# Patient Record
Sex: Female | Born: 1950 | Race: White | Hispanic: No | Marital: Married | Smoking: Former smoker
Health system: Southern US, Community
[De-identification: ages and names within clinical notes are randomized; demographics above are authoritative.]

## PROBLEM LIST (undated history)

## (undated) DIAGNOSIS — E119 Type 2 diabetes mellitus without complications: Secondary | ICD-10-CM

## (undated) DIAGNOSIS — F329 Major depressive disorder, single episode, unspecified: Secondary | ICD-10-CM

## (undated) DIAGNOSIS — C210 Malignant neoplasm of anus, unspecified: Secondary | ICD-10-CM

## (undated) DIAGNOSIS — I10 Essential (primary) hypertension: Secondary | ICD-10-CM

## (undated) DIAGNOSIS — F32A Depression, unspecified: Secondary | ICD-10-CM

## (undated) DIAGNOSIS — I2699 Other pulmonary embolism without acute cor pulmonale: Secondary | ICD-10-CM

## (undated) DIAGNOSIS — I82409 Acute embolism and thrombosis of unspecified deep veins of unspecified lower extremity: Secondary | ICD-10-CM

## (undated) DIAGNOSIS — C50919 Malignant neoplasm of unspecified site of unspecified female breast: Secondary | ICD-10-CM

## (undated) HISTORY — PX: BACK SURGERY: SHX140

## (undated) HISTORY — PX: COLOSTOMY: SHX63

## (undated) HISTORY — PX: BREAST LUMPECTOMY: SHX2

## (undated) HISTORY — DX: Depression, unspecified: F32.A

## (undated) HISTORY — PX: TONSILLECTOMY: SUR1361

## (undated) HISTORY — PX: MEDIAL PARTIAL KNEE REPLACEMENT: SHX5965

## (undated) HISTORY — PX: CARPAL TUNNEL RELEASE: SHX101

## (undated) HISTORY — PX: NECK SURGERY: SHX720

## (undated) HISTORY — DX: Acute embolism and thrombosis of unspecified deep veins of unspecified lower extremity: I82.409

## (undated) HISTORY — DX: Other pulmonary embolism without acute cor pulmonale: I26.99

## (undated) HISTORY — PX: ABDOMINAL HYSTERECTOMY: SHX81

## (undated) HISTORY — DX: Malignant neoplasm of anus, unspecified: C21.0

## (undated) HISTORY — DX: Major depressive disorder, single episode, unspecified: F32.9

---

## 2013-02-26 ENCOUNTER — Ambulatory Visit: Payer: Self-pay | Admitting: Physician Assistant

## 2015-01-02 ENCOUNTER — Emergency Department
Admission: EM | Admit: 2015-01-02 | Discharge: 2015-01-02 | Disposition: A | Discharge status: Home / Self Care | Payer: Medicare Other | Attending: Emergency Medicine | Admitting: Emergency Medicine

## 2015-01-02 ENCOUNTER — Encounter: Payer: Self-pay | Admitting: Emergency Medicine

## 2015-01-02 DIAGNOSIS — R51 Headache: Secondary | ICD-10-CM | POA: Insufficient documentation

## 2015-01-02 DIAGNOSIS — R519 Headache, unspecified: Secondary | ICD-10-CM

## 2015-01-02 DIAGNOSIS — I10 Essential (primary) hypertension: Secondary | ICD-10-CM | POA: Insufficient documentation

## 2015-01-02 DIAGNOSIS — Z880 Allergy status to penicillin: Secondary | ICD-10-CM | POA: Insufficient documentation

## 2015-01-02 DIAGNOSIS — E119 Type 2 diabetes mellitus without complications: Secondary | ICD-10-CM | POA: Insufficient documentation

## 2015-01-02 HISTORY — DX: Malignant neoplasm of unspecified site of unspecified female breast: C50.919

## 2015-01-02 HISTORY — DX: Essential (primary) hypertension: I10

## 2015-01-02 HISTORY — DX: Type 2 diabetes mellitus without complications: E11.9

## 2015-01-02 LAB — CBC WITH DIFFERENTIAL/PLATELET
BASOS ABS: 0.1 10*3/uL (ref 0–0.1)
BASOS PCT: 1 %
EOS ABS: 0.2 10*3/uL (ref 0–0.7)
EOS PCT: 3 %
HCT: 39.8 % (ref 35.0–47.0)
Hemoglobin: 13.1 g/dL (ref 12.0–16.0)
Lymphocytes Relative: 28 %
Lymphs Abs: 1.6 10*3/uL (ref 1.0–3.6)
MCH: 29.8 pg (ref 26.0–34.0)
MCHC: 32.8 g/dL (ref 32.0–36.0)
MCV: 91 fL (ref 80.0–100.0)
MONO ABS: 0.4 10*3/uL (ref 0.2–0.9)
Monocytes Relative: 8 %
Neutro Abs: 3.4 10*3/uL (ref 1.4–6.5)
Neutrophils Relative %: 60 %
PLATELETS: 133 10*3/uL — AB (ref 150–440)
RBC: 4.38 MIL/uL (ref 3.80–5.20)
RDW: 17.1 % — AB (ref 11.5–14.5)
WBC: 5.6 10*3/uL (ref 3.6–11.0)

## 2015-01-02 LAB — COMPREHENSIVE METABOLIC PANEL
ALBUMIN: 3.9 g/dL (ref 3.5–5.0)
ALT: 30 U/L (ref 14–54)
AST: 30 U/L (ref 15–41)
Alkaline Phosphatase: 132 U/L — ABNORMAL HIGH (ref 38–126)
Anion gap: 6 (ref 5–15)
BUN: 14 mg/dL (ref 6–20)
CHLORIDE: 105 mmol/L (ref 101–111)
CO2: 29 mmol/L (ref 22–32)
Calcium: 8.9 mg/dL (ref 8.9–10.3)
Creatinine, Ser: 0.74 mg/dL (ref 0.44–1.00)
GFR calc Af Amer: >60 mL/min (ref >60)
GLUCOSE: 102 mg/dL — AB (ref 65–99)
POTASSIUM: 4 mmol/L (ref 3.5–5.1)
SODIUM: 140 mmol/L (ref 135–145)
Total Bilirubin: 0.2 mg/dL — ABNORMAL LOW (ref 0.3–1.2)
Total Protein: 7 g/dL (ref 6.5–8.1)

## 2015-01-02 MED ORDER — KETOROLAC TROMETHAMINE 30 MG/ML IJ SOLN
INTRAMUSCULAR | Status: AC
  Administered 2015-01-02: 30 mg via INTRAVENOUS
  Filled 2015-01-02: qty 1

## 2015-01-02 MED ORDER — HEPARIN SOD (PORK) LOCK FLUSH 100 UNIT/ML IV SOLN
INTRAVENOUS | Status: AC
  Administered 2015-01-02: 500 [IU]
  Filled 2015-01-02: qty 5

## 2015-01-02 MED ORDER — HEPARIN SOD (PORK) LOCK FLUSH 10 UNIT/ML IV SOLN: 10.0000 [IU] | Freq: Once | INTRAVENOUS | Status: DC

## 2015-01-02 MED ORDER — SODIUM CHLORIDE 0.9 % IV SOLN
Freq: Once | INTRAVENOUS | Status: AC
  Administered 2015-01-02: 21:00:00 via INTRAVENOUS

## 2015-01-02 MED ORDER — KETOROLAC TROMETHAMINE 30 MG/ML IJ SOLN
30.0000 mg | Freq: Once | INTRAMUSCULAR | Status: AC
  Administered 2015-01-02: 30 mg via INTRAVENOUS

## 2015-01-02 NOTE — ED Notes (Signed)
Patient presents to ED with c/o "I am dehydrated and have a headache since yesterday." States she has been eating and drinking water, + photosensitivity and dizziness when standing. Denies N/V, abdominal pain or chest pain. Patient is a cancer patient. Alert and oriented x 4, respirations even and unlabored. Family at bedside.

## 2015-01-02 NOTE — ED Notes (Signed)
Pt arrived to the ED for complaints of headache and dehydration. Pt states that she is a cancer Pt and she has many episodes of dehydration per month. Pt is AOx4 in no apparent distress.

## 2015-01-02 NOTE — Discharge Instructions (Signed)

## 2015-01-02 NOTE — ED Provider Notes (Addendum)
[facility] Emergency Department Provider Note     Time seen: ----------------------------------------- 7:51 PM on 01/02/2015 -----------------------------------------    I have reviewed the triage vital signs and the nursing notes.   HISTORY  Chief Complaint Headache and Dehydration    HPI Dana Wilson is a 64 y.o. female who presents ER for complaints of headache and dehydration. Patient states she has cancer and she has many episodes of dehydration per month. Patient notes this by demonstrating skin tenting on the backs of her hands. States she breaks out into a sweat frequently, has had nausea but no vomiting or diarrhea. Patient denies fevers chills or other complaints     Past Medical History  Diagnosis Date  . Breast cancer   . Diabetes mellitus without complication   . Hypertension     There are no active problems to display for this patient.   Past Surgical History  Procedure Laterality Date  . Medial partial knee replacement    . Neck surgery    . Back surgery    . Carpal tunnel release    . Breast lumpectomy      Allergies Other; Penicillins; and Tape  Social History Social History  Substance Use Topics  . Smoking status: Never Smoker   . Smokeless tobacco: None  . Alcohol Use: No    Review of Systems Constitutional: Negative for fever, positive for sweats Eyes: Negative for visual changes. ENT: Negative for sore throat. Cardiovascular: Negative for chest pain. Respiratory: Negative for shortness of breath. Gastrointestinal: Negative for abdominal pain, positive for nausea Genitourinary: Negative for dysuria. Musculoskeletal: Negative for back pain. Skin: Negative for rash. Neurological: Positive for headache  10-point ROS otherwise negative.  ____________________________________________   PHYSICAL EXAM:  VITAL SIGNS: ED Triage Vitals  Enc Vitals Group     BP 01/02/15 1921 137/78 mmHg     Pulse Rate  01/02/15 1921 75     Resp 01/02/15 1921 18     Temp 01/02/15 1921 98.7 F (37.1 C)     Temp Source 01/02/15 1921 Oral     SpO2 01/02/15 1921 94 %     Weight 01/02/15 1921 238 lb (107.956 kg)     Height 01/02/15 1921 5\' 4"  (1.626 m)     Head Cir --      Peak Flow --      Pain Score 01/02/15 1923 8     Pain Loc --      Pain Edu? --      Excl. in [place]? --     Constitutional: Alert and oriented. Well appearing and in no distress. Eyes: Conjunctivae are normal. PERRL. Normal extraocular movements. ENT   Head: Normocephalic and atraumatic.   Nose: No congestion/rhinnorhea.   Mouth/Throat: Mucous membranes are moist.   Neck: No stridor. Cardiovascular: Normal rate, regular rhythm. Normal and symmetric distal pulses are present in all extremities. No murmurs, rubs, or gallops. Respiratory: Normal respiratory effort without tachypnea nor retractions. Breath sounds are clear and equal bilaterally. No wheezes/rales/rhonchi. Gastrointestinal: Soft and nontender. No distention. No abdominal bruits.  Musculoskeletal: Nontender with normal range of motion in all extremities. No joint effusions.  No lower extremity tenderness nor edema. Neurologic:  Normal speech and language. No gross focal neurologic deficits are appreciated. Speech is normal. No gait instability. Skin:  Skin is warm, dry and intact. No rash noted. Psychiatric: Mood and affect are normal. Speech and behavior are normal. Patient exhibits appropriate insight and judgment.  ____________________________________________  ED  COURSE:  Pertinent labs & imaging results that were available during my care of the patient were reviewed by me and considered in my medical decision making (see chart for details). Patient be given saline, will check basic labs and reevaluate with IV Toradol for headache. ____________________________________________    LABS (pertinent positives/negatives)  Labs Reviewed  CBC WITH  DIFFERENTIAL/PLATELET - Abnormal; Notable for the following:    RDW 17.1 (*)    Platelets 133 (*)    All other components within normal limits  COMPREHENSIVE METABOLIC PANEL - Abnormal; Notable for the following:    Glucose, Bld 102 (*)    Alkaline Phosphatase 132 (*)    Total Bilirubin 0.2 (*)    All other components within normal limits    ____________________________________________  FINAL ASSESSMENT AND PLAN  Headache  Plan: Patient with labs and imaging as dictated above. Patient does not appear significantly dehydrated, she was given saline and Toradol to ease her symptoms. She is stable for outpatient follow-up.   Earleen Newport, MD   Earleen Newport, MD 01/02/15 2003  Earleen Newport, MD 01/02/15 6384  Earleen Newport, MD 01/02/15 2236

## 2017-09-23 ENCOUNTER — Encounter: Payer: Self-pay | Admitting: Oncology

## 2017-09-23 ENCOUNTER — Inpatient Hospital Stay: Payer: Medicare Other

## 2017-09-23 ENCOUNTER — Inpatient Hospital Stay: Payer: Medicare Other | Attending: Oncology | Admitting: Oncology

## 2017-09-23 VITALS — BP 138/81 | HR 73 | Temp 97.9°F | Resp 18 | Ht 64.0 in | Wt 259.2 lb

## 2017-09-23 DIAGNOSIS — Z803 Family history of malignant neoplasm of breast: Secondary | ICD-10-CM | POA: Insufficient documentation

## 2017-09-23 DIAGNOSIS — Z933 Colostomy status: Secondary | ICD-10-CM

## 2017-09-23 DIAGNOSIS — Z800 Family history of malignant neoplasm of digestive organs: Secondary | ICD-10-CM | POA: Insufficient documentation

## 2017-09-23 DIAGNOSIS — R5383 Other fatigue: Secondary | ICD-10-CM | POA: Diagnosis not present

## 2017-09-23 DIAGNOSIS — R5381 Other malaise: Secondary | ICD-10-CM | POA: Insufficient documentation

## 2017-09-23 DIAGNOSIS — Z86711 Personal history of pulmonary embolism: Secondary | ICD-10-CM | POA: Diagnosis not present

## 2017-09-23 DIAGNOSIS — Z95828 Presence of other vascular implants and grafts: Secondary | ICD-10-CM

## 2017-09-23 DIAGNOSIS — Z853 Personal history of malignant neoplasm of breast: Secondary | ICD-10-CM | POA: Diagnosis present

## 2017-09-23 DIAGNOSIS — Z9221 Personal history of antineoplastic chemotherapy: Secondary | ICD-10-CM

## 2017-09-23 DIAGNOSIS — Z87440 Personal history of urinary (tract) infections: Secondary | ICD-10-CM

## 2017-09-23 DIAGNOSIS — E119 Type 2 diabetes mellitus without complications: Secondary | ICD-10-CM | POA: Diagnosis not present

## 2017-09-23 DIAGNOSIS — Z85048 Personal history of other malignant neoplasm of rectum, rectosigmoid junction, and anus: Secondary | ICD-10-CM | POA: Diagnosis not present

## 2017-09-23 DIAGNOSIS — Z452 Encounter for adjustment and management of vascular access device: Secondary | ICD-10-CM | POA: Diagnosis not present

## 2017-09-23 DIAGNOSIS — Z807 Family history of other malignant neoplasms of lymphoid, hematopoietic and related tissues: Secondary | ICD-10-CM | POA: Insufficient documentation

## 2017-09-23 DIAGNOSIS — I10 Essential (primary) hypertension: Secondary | ICD-10-CM | POA: Diagnosis not present

## 2017-09-23 DIAGNOSIS — Z79899 Other long term (current) drug therapy: Secondary | ICD-10-CM

## 2017-09-23 DIAGNOSIS — Z923 Personal history of irradiation: Secondary | ICD-10-CM | POA: Diagnosis not present

## 2017-09-23 DIAGNOSIS — Z87891 Personal history of nicotine dependence: Secondary | ICD-10-CM | POA: Insufficient documentation

## 2017-09-23 DIAGNOSIS — Z7901 Long term (current) use of anticoagulants: Secondary | ICD-10-CM | POA: Insufficient documentation

## 2017-09-23 DIAGNOSIS — C20 Malignant neoplasm of rectum: Secondary | ICD-10-CM

## 2017-09-23 DIAGNOSIS — C50919 Malignant neoplasm of unspecified site of unspecified female breast: Secondary | ICD-10-CM

## 2017-09-23 MED ORDER — HEPARIN SOD (PORK) LOCK FLUSH 100 UNIT/ML IV SOLN
500.0000 [IU] | Freq: Once | INTRAVENOUS | Status: AC
  Administered 2017-09-23: 500 [IU] via INTRAVENOUS

## 2017-09-23 MED ORDER — SODIUM CHLORIDE 0.9% FLUSH
10.0000 mL | INTRAVENOUS | Status: DC | PRN
  Administered 2017-09-23: 10 mL via INTRAVENOUS
  Filled 2017-09-23: qty 10

## 2017-09-23 NOTE — Progress Notes (Signed)
Pt here from [place] and she stay with her daughter and grand children for extended amounts of time. No sx. Just port flush.

## 2017-09-23 NOTE — Progress Notes (Signed)
Symptom Management Consult note [facility]  [phone] [phone]  Patient Care Team: Georga Kaufmann, [place] as PCP - General (Pain Medicine)   Name of the patient: Brylee Berk  [id number]  12/03/1950   Date of visit: 09/23/17  Diagnosis- Breast and Anal Cancer   Chief complaint/ Reason for visit- PORT Flush/ New patient   Heme/Onc history:  67 year old female visiting from [place], [place].  Patient presents for routine port flush.  States she has a history of stage II left breast cancer (2015) s/p lumpectomy.  Had chemo and radiation.  States she did not require "hormone pill" after chemotherapy.  Additionally she has a history of anal cancer diagnosed in May 2016.  In patient thought she had hemorrhoids and went to her PCP.  Biopsy revealed anal cancer.  She also had radiation and chemo with colostomy in December 2016.  Required skilled nursing facility for several months after surgery.  Has fully recovered and receives follow-up in [place] where she was with her husband.  Had recent back surgery in January 2019.  States she is doing well.  Unfortunately, she does not have any records available with her.   Has history of recent urinary tract infection treated with antibiotics and pulmonary embolism.  On chronic Xarelto since 2018.  States she is here often would like to establish care so that she may have her port flushed while visiting her daughters..  Interval history-  Patient presents to have her port flushed.  Last port flush was approximately 6 weeks ago.  She does not plan to return home to [place] for approximately 1 more month and is afraid of her Port-A-Cath becoming "clogged".  Requesting port flush if possible.  Denies any problems with her port in the past.  Denies any recent infections.  Continuing to recover from recent back surgery.  Overall doing well.  ECOG FS:0 - Asymptomatic  Review of systems- Review of Systems    Constitutional: Positive for malaise/fatigue (From previous chemo/radiation from anal and breast cancer.). Negative for chills, fever and weight loss.  HENT: Negative for congestion and ear pain.   Eyes: Negative.  Negative for blurred vision and double vision.  Respiratory: Negative.  Negative for cough, sputum production and shortness of breath.   Cardiovascular: Negative.  Negative for chest pain, palpitations and leg swelling.  Gastrointestinal: Negative.  Negative for abdominal pain, constipation, diarrhea, nausea and vomiting.  Genitourinary: Negative for dysuria, frequency and urgency.  Musculoskeletal: Positive for back pain (Chronic back pain.  Recent back surgery.). Negative for falls.  Skin: Negative.  Negative for rash.  Neurological: Positive for weakness (Chronic.  Continuing to recover from back surgery.). Negative for headaches.  Endo/Heme/Allergies: Negative.  Does not bruise/bleed easily.  Psychiatric/Behavioral: Negative.  Negative for depression. The patient is not nervous/anxious and does not have insomnia.      Current treatment- Observation  Allergies  Allergen Reactions  . Calcium Diarrhea and Other (See Comments)    gas  . Other Other (See Comments)    Surgical steel gives an infection to the Pt.   Marland Kitchen Penicillins Hives  . Zolpidem Other (See Comments)    confusion  . Amoxicillin Rash  . Ceftriaxone Rash  . Cifenline Rash  . Ciprofloxacin Rash  . Levofloxacin Rash  . Tape Rash     Past Medical History:  Diagnosis Date  . Anal cancer ([place])   . Breast cancer ([place])   . Depression   . Diabetes mellitus  without complication ([place])   . DVT (deep venous thrombosis) ([place])   . Hypertension   . Pulmonary embolus [facility])      Past Surgical History:  Procedure Laterality Date  . ABDOMINAL HYSTERECTOMY    . BACK SURGERY    . BREAST LUMPECTOMY    . CARPAL TUNNEL RELEASE    . MEDIAL PARTIAL KNEE REPLACEMENT    . NECK SURGERY    . TONSILLECTOMY       Social History   Socioeconomic History  . Marital status: Married    Spouse name: Not on file  . Number of children: Not on file  . Years of education: Not on file  . Highest education level: Not on file  Occupational History  . Not on file  Social Needs  . Financial resource strain: Not on file  . Food insecurity:    Worry: Not on file    Inability: Not on file  . Transportation needs:    Medical: Not on file    Non-medical: Not on file  Tobacco Use  . Smoking status: Former Smoker    Last attempt to quit: 01/17/1990    Years since quitting: 27.7  . Smokeless tobacco: Never Used  Substance and Sexual Activity  . Alcohol use: No  . Drug use: No  . Sexual activity: Never  Lifestyle  . Physical activity:    Days per week: Not on file    Minutes per session: Not on file  . Stress: Not on file  Relationships  . Social connections:    Talks on phone: Not on file    Gets together: Not on file    Attends religious service: Not on file    Active member of club or organization: Not on file    Attends meetings of clubs or organizations: Not on file    Relationship status: Not on file  . Intimate partner violence:    Fear of current or ex partner: Not on file    Emotionally abused: Not on file    Physically abused: Not on file    Forced sexual activity: Not on file  Other Topics Concern  . Not on file  Social History Narrative  . Not on file    Family History  Problem Relation Age of Onset  . COPD Mother   . Emphysema Mother   . Alzheimer's disease Father   . Stroke Sister   . Diabetes Sister   . Hypertension Sister   . Liver cancer Brother   . Diabetes Brother   . Hypertension Brother   . Breast cancer Paternal Aunt   . Breast cancer Cousin   . Colon cancer Cousin   . Prostate cancer Brother   . Diabetes Brother   . Hypertension Brother   . Sleep apnea Sister      Current Outpatient Medications:  .  buPROPion (ZYBAN) 150 MG 12 hr tablet, Take 150 mg  by mouth daily., Disp: , Rfl:  .  DULoxetine (CYMBALTA) 60 MG capsule, Take 60 mg by mouth daily., Disp: , Rfl:  .  enalapril (VASOTEC) 20 MG tablet, Take 20 mg by mouth daily., Disp: , Rfl:  .  esomeprazole (NEXIUM) 40 MG capsule, Take 40 mg by mouth daily at 12 noon., Disp: , Rfl:  .  gabapentin (NEURONTIN) 100 MG capsule, Take 100 mg by mouth 3 (three) times daily., Disp: , Rfl:  .  HYDROcodone-acetaminophen (NORCO) 10-325 MG tablet, Take 1 tablet by mouth every 6 (six) hours  as needed., Disp: , Rfl:  .  meclizine (ANTIVERT) 25 MG tablet, Take 25 mg by mouth 3 (three) times daily as needed for dizziness., Disp: , Rfl:  .  ondansetron (ZOFRAN) 8 MG tablet, Take by mouth every 8 (eight) hours as needed for nausea or vomiting., Disp: , Rfl:  .  rivaroxaban (XARELTO) 20 MG TABS tablet, Take 20 mg by mouth daily with supper., Disp: , Rfl:  .  rizatriptan (MAXALT-MLT) 10 MG disintegrating tablet, Take 10 mg by mouth as needed for migraine. May repeat in 2 hours if needed, Disp: , Rfl:  .  solifenacin (VESICARE) 10 MG tablet, Take by mouth daily., Disp: , Rfl:   Physical exam:  Vitals:   09/23/17 1053  BP: 138/81  Pulse: 73  Resp: 18  Temp: 97.9 F (36.6 C)  TempSrc: Tympanic  Weight: 259 lb 3.2 oz (117.6 kg)  Height: 5\' 4"  (1.626 m)   Physical Exam  Constitutional: She is oriented to person, place, and time. Vital signs are normal.  HENT:  Head: Normocephalic and atraumatic.  Eyes: Pupils are equal, round, and reactive to light.  Neck: Normal range of motion.  Cardiovascular: Normal rate, regular rhythm and normal heart sounds.  No murmur heard. Pulmonary/Chest: Effort normal and breath sounds normal. She has no wheezes.  Abdominal: Soft. Normal appearance and bowel sounds are normal. She exhibits no distension. There is no tenderness.  Musculoskeletal: Normal range of motion. She exhibits no edema.  Neurological: She is alert and oriented to person, place, and time.  Skin: Skin is  warm and dry. No rash noted.  Psychiatric: Judgment normal.     CMP Latest Ref Rng & Units 01/02/2015  Glucose 65 - 99 mg/dL 102(H)  BUN 6 - 20 mg/dL 14  Creatinine 0.44 - 1.00 mg/dL 0.74  Sodium 135 - 145 mmol/L 140  Potassium 3.5 - 5.1 mmol/L 4.0  Chloride 101 - 111 mmol/L 105  CO2 22 - 32 mmol/L 29  Calcium 8.9 - 10.3 mg/dL 8.9  Total Protein 6.5 - 8.1 g/dL 7.0  Total Bilirubin 0.3 - 1.2 mg/dL 0.2(L)  Alkaline Phos 38 - 126 U/L 132(H)  AST 15 - 41 U/L 30  ALT 14 - 54 U/L 30   CBC Latest Ref Rng & Units 01/02/2015  WBC 3.6 - 11.0 K/uL 5.6  Hemoglobin 12.0 - 16.0 g/dL 13.1  Hematocrit 35.0 - 47.0 % 39.8  Platelets 150 - 440 K/uL 133(L)    No images are attached to the encounter.  No results found.   Assessment and plan- Patient is a 67 y.o. female who presents for routine port flush.  Visiting from [place].  1.  History of stage II breast and rectal cancer: Requesting records be sent [facility] for [facility].  States he had left breast lumpectomy with chemo/radiation.  History of rectal cancer s/p chemo and radiation.  Followed and managed by primary medical oncologist in [place].  Requesting routine port flush.    Visit Diagnosis 1. Malignant neoplasm of female breast, unspecified estrogen receptor status, unspecified laterality, unspecified site of breast [facility])     Patient expressed understanding and was in agreement with this plan. She also understands that She can call clinic at any time with any questions, concerns, or complaints.   Greater than 50% was spent in counseling and coordination of care with this patient including but not limited to discussion of the relevant topics above (See A&P) including, but not limited to diagnosis and management of  acute and chronic medical conditions.    Faythe Casa, AGNP-C [facility] [place]- [phone] Pager- [phone] 10/04/2017 2:31 PM

## 2017-09-24 ENCOUNTER — Telehealth: Payer: Self-pay | Admitting: Oncology

## 2017-09-24 NOTE — Telephone Encounter (Signed)
No LOS, per 09/23/17 visit.

## 2019-07-21 ENCOUNTER — Encounter: Payer: Self-pay | Admitting: Emergency Medicine

## 2019-07-21 ENCOUNTER — Other Ambulatory Visit: Payer: Self-pay

## 2019-07-21 ENCOUNTER — Ambulatory Visit
Admission: EM | Admit: 2019-07-21 | Discharge: 2019-07-21 | Disposition: A | Discharge status: Home / Self Care | Payer: Medicare Other | Attending: Family Medicine | Admitting: Family Medicine

## 2019-07-21 DIAGNOSIS — N390 Urinary tract infection, site not specified: Secondary | ICD-10-CM | POA: Insufficient documentation

## 2019-07-21 DIAGNOSIS — N760 Acute vaginitis: Secondary | ICD-10-CM

## 2019-07-21 LAB — WET PREP, GENITAL
Clue Cells Wet Prep HPF POC: NONE SEEN
Sperm: NONE SEEN
Trich, Wet Prep: NONE SEEN
Yeast Wet Prep HPF POC: NONE SEEN

## 2019-07-21 LAB — URINALYSIS, COMPLETE (UACMP) WITH MICROSCOPIC
Bilirubin Urine: NEGATIVE
Glucose, UA: NEGATIVE mg/dL
Ketones, ur: NEGATIVE mg/dL
Nitrite: NEGATIVE
Protein, ur: 30 mg/dL — AB
Specific Gravity, Urine: >1.03 — ABNORMAL HIGH (ref 1.005–1.030)
pH: 6 (ref 5.0–8.0)

## 2019-07-21 MED ORDER — FLUCONAZOLE 200 MG PO TABS: ORAL_TABLET | ORAL | 0 refills | Status: DC

## 2019-07-21 MED ORDER — SULFAMETHOXAZOLE-TRIMETHOPRIM 800-160 MG PO TABS: 1.0000 | ORAL_TABLET | Freq: Two times a day (BID) | ORAL | 0 refills | Status: AC

## 2019-07-21 NOTE — ED Triage Notes (Signed)
Patient c/o burning when urinating and vaginal itching that started 4 days ago.

## 2019-07-21 NOTE — Discharge Instructions (Signed)
Your urine is consistent with UTI. Complete course of antibiotics.  I have sent your urine to be cultured to confirm appropriate antibiotic initiation.We would call you if culture indicates need to change antibiotics.  Your wet prep does not have yeast present today, however, with your symptoms and history, as well with starting antibiotics, I will go ahead and treat for yeast. Take 1 pill today and repeat second at completion of antibiotics.  If symptoms worsen or do not improve in the next week to return to be seen or to follow up with your PCP.

## 2019-07-21 NOTE — ED Provider Notes (Signed)
[facility]    CSN: [id number] Arrival date & time: 07/21/19  1431      History   Chief Complaint Chief Complaint  Patient presents with  . Dysuria  . Vaginal Itching    HPI BLIMY TUDOR is a 69 y.o. female.   Avon Gully presents with complaints of vaginal itching which started approximately 3-4 days ago, along with pain with pain with urination, urgency and frequency with urination. No blood to urine noted. No new back pain. No fevers. History of vaginal yeast as well as UTI's which have felt similar. History of DM. 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    ROS per HPI, negative if not otherwise mentioned.      Past Medical History:  Diagnosis Date  . Anal cancer ([place])   . Breast cancer ([place])   . Depression   . Diabetes mellitus without complication ([place])   . DVT (deep venous thrombosis) ([place])   . Hypertension   . Pulmonary embolus ([facility])     There are no problems to display for this patient.   Past Surgical History:  Procedure Laterality Date  . ABDOMINAL HYSTERECTOMY    . BACK SURGERY    . BREAST LUMPECTOMY    . CARPAL TUNNEL RELEASE    . MEDIAL PARTIAL KNEE REPLACEMENT    . NECK SURGERY    . TONSILLECTOMY      OB History    Gravida  5   Para  3   Term  3   Preterm  0   AB  2   Living  0     SAB  2   TAB  0   Ectopic  0   Multiple  0   Live Births               Home Medications    Prior to Admission medications   Medication Sig Start Date End Date Taking? Authorizing Provider  buPROPion (ZYBAN) 150 MG 12 hr tablet Take 150 mg by mouth daily.   Yes [provider]  DULoxetine (CYMBALTA) 60 MG capsule Take 60 mg by mouth daily.   Yes [provider]  enalapril (VASOTEC) 20 MG tablet Take 20 mg by mouth daily.   Yes [provider]  esomeprazole (NEXIUM) 40 MG capsule Take 40 mg by mouth daily at 12 noon.   Yes [provider]  gabapentin (NEURONTIN) 100 MG  capsule Take 100 mg by mouth 3 (three) times daily.   Yes [provider]  HYDROcodone-acetaminophen (NORCO) 10-325 MG tablet Take 1 tablet by mouth every 6 (six) hours as needed.   Yes [provider]  metFORMIN (GLUCOPHAGE-XR) 500 MG 24 hr tablet Take 500 mg by mouth daily. 07/16/19  Yes [provider]  ondansetron (ZOFRAN-ODT) 8 MG disintegrating tablet Take 8 mg by mouth 3 (three) times daily. 06/26/19  Yes [provider]  rivaroxaban (XARELTO) 20 MG TABS tablet Take 20 mg by mouth daily with supper.   Yes [provider]  rizatriptan (MAXALT-MLT) 10 MG disintegrating tablet Take 10 mg by mouth as needed for migraine. May repeat in 2 hours if needed   Yes [provider]  rOPINIRole (REQUIP) 1 MG tablet Take 1 mg by mouth at bedtime.   Yes [provider]  solifenacin (VESICARE) 10 MG tablet Take by mouth daily.   Yes [provider]  TRELEGY ELLIPTA 100-62.5-25 MCG/INH AEPB Inhale 1 puff into the lungs daily.  07/17/19  Yes [provider]  fluconazole (DIFLUCAN) 200 MG tablet Take once today. Take second pill at completion of antibiotics. 07/21/19   Zigmund Gottron, NP  meclizine (ANTIVERT) 25 MG tablet Take 25 mg by mouth 3 (three) times daily as needed for dizziness.    [provider]  ondansetron (ZOFRAN) 8 MG tablet Take by mouth every 8 (eight) hours as needed for nausea or vomiting.    [provider]  sulfamethoxazole-trimethoprim (BACTRIM DS) 800-160 MG tablet Take 1 tablet by mouth 2 (two) times daily for 5 days. 07/21/19 07/26/19  Zigmund Gottron, NP    Family History Family History  Problem Relation Age of Onset  . COPD Mother   . Emphysema Mother   . Alzheimer's disease Father   . Stroke Sister   . Diabetes Sister   . Hypertension Sister   . Liver cancer Brother   . Diabetes Brother   . Hypertension Brother   . Breast cancer Paternal Aunt   . Breast cancer Cousin   . Colon  cancer Cousin   . Prostate cancer Brother   . Diabetes Brother   . Hypertension Brother   . Sleep apnea Sister     Social History Social History   Tobacco Use  . Smoking status: Former Smoker    Quit date: 01/17/1990    Years since quitting: 29.5  . Smokeless tobacco: Never Used  Substance Use Topics  . Alcohol use: No  . Drug use: No     Allergies   Calcium, Other, Penicillins, Zolpidem, Amoxicillin, Ceftriaxone, Cifenline, Ciprofloxacin, Levofloxacin, and Tape   Review of Systems Review of Systems   Physical Exam Triage Vital Signs ED Triage Vitals  Enc Vitals Group     BP 07/21/19 1445 139/84     Pulse Rate 07/21/19 1445 70     Resp 07/21/19 1445 16     Temp 07/21/19 1445 98.3 F (36.8 C)     Temp Source 07/21/19 1445 Oral     SpO2 07/21/19 1445 96 %     Weight 07/21/19 1439 270 lb (122.5 kg)     Height 07/21/19 1439 5\' 3"  (1.6 m)     Head Circumference --      Peak Flow --      Pain Score 07/21/19 1439 3     Pain Loc --      Pain Edu? --      Excl. in [place]? --    No data found.  Updated Vital Signs BP 139/84 (BP Location: Right Arm)   Pulse 70   Temp 98.3 F (36.8 C) (Oral)   Resp 16   Ht 5\' 3"  (1.6 m)   Wt 270 lb (122.5 kg)   SpO2 96%   BMI 47.83 kg/m   Physical Exam Constitutional:      General: She is not in acute distress.    Appearance: She is well-developed.  Cardiovascular:     Rate and Rhythm: Normal rate.  Pulmonary:     Effort: Pulmonary effort is normal.  Abdominal:     Tenderness: There is no abdominal tenderness. There is no right CVA tenderness, left CVA tenderness or guarding.  Skin:    General: Skin is warm and dry.  Neurological:     Mental Status: She is alert and oriented to person, place, and time.      UC Treatments / Results  Labs (all labs ordered are listed, but only abnormal results are displayed) Labs Reviewed  WET PREP, GENITAL - Abnormal; Notable for the following components:      Result Value   WBC,  Wet Prep HPF POC FEW (*)    All other components within normal limits  URINALYSIS, COMPLETE (UACMP) WITH MICROSCOPIC - Abnormal; Notable for the following components:   APPearance CLOUDY (*)    Specific Gravity, Urine >1.030 (*)    Hgb urine dipstick SMALL (*)    Protein, ur 30 (*)    Leukocytes,Ua MODERATE (*)    Bacteria, UA FEW (*)    All other components within normal limits  URINE CULTURE    EKG   Radiology No results found.  Procedures Procedures (including critical care time)  Medications Ordered in UC Medications - No data to display  Initial Impression / Assessment and Plan / UC Course  I have reviewed the triage vital signs and the nursing notes.  Pertinent labs & imaging results that were available during my care of the patient were reviewed by me and considered in my medical decision making (see chart for details).     Urinary symptoms with leuks, few bacteria and small hgb, will treat for uti. Culture pending. Wet prep without yeast. However, with significant itching, DM, and initiating antibiotics, opted to cover with diflucan. Discussed follow up if this does not improve, as may need to consider other sources of itching. Return precautions provided. Patient verbalized understanding and agreeable to plan.   Final Clinical Impressions(s) / UC Diagnoses   Final diagnoses:  Lower urinary tract infectious disease  Acute vaginitis     Discharge Instructions     Your urine is consistent with UTI. Complete course of antibiotics.  I have sent your urine to be cultured to confirm appropriate antibiotic initiation.We would call you if culture indicates need to change antibiotics.  Your wet prep does not have yeast present today, however, with your symptoms and history, as well with starting antibiotics, I will go ahead and treat for yeast. Take 1 pill today and repeat second at completion of antibiotics.  If symptoms worsen or do not improve in the next week to  return to be seen or to follow up with your PCP.      ED Prescriptions    Medication Sig Dispense Auth. Provider   fluconazole (DIFLUCAN) 200 MG tablet Take once today. Take second pill at completion of antibiotics. 2 tablet Augusto Gamble B, NP   sulfamethoxazole-trimethoprim (BACTRIM DS) 800-160 MG tablet Take 1 tablet by mouth 2 (two) times daily for 5 days. 10 tablet Zigmund Gottron, NP     PDMP not reviewed this encounter.   Zigmund Gottron, NP 07/21/19 1523

## 2019-07-24 LAB — URINE CULTURE: Culture: >=100000 — AB

## 2019-08-09 ENCOUNTER — Other Ambulatory Visit: Payer: Self-pay

## 2019-08-09 ENCOUNTER — Ambulatory Visit: Payer: Medicare Other

## 2019-08-09 ENCOUNTER — Ambulatory Visit
Admission: EM | Admit: 2019-08-09 | Discharge: 2019-08-09 | Disposition: A | Discharge status: Home / Self Care | Payer: Medicare Other | Attending: Emergency Medicine | Admitting: Emergency Medicine

## 2019-08-09 DIAGNOSIS — M25512 Pain in left shoulder: Secondary | ICD-10-CM | POA: Insufficient documentation

## 2019-08-09 DIAGNOSIS — M542 Cervicalgia: Secondary | ICD-10-CM | POA: Diagnosis present

## 2019-08-09 MED ORDER — TIZANIDINE HCL 4 MG PO TABS: 4.0000 mg | ORAL_TABLET | Freq: Three times a day (TID) | ORAL | 0 refills | Status: DC | PRN

## 2019-08-09 MED ORDER — METHYLPREDNISOLONE 4 MG PO TBPK: ORAL_TABLET | Freq: Every day | ORAL | 0 refills | Status: DC

## 2019-08-09 NOTE — ED Triage Notes (Addendum)
Pt presents with c/o pain radiating down her neck into her left shoulder/arm increasing over the past few days. Pt has history of cervical surgery last year to correct a possible ruptured disc (pt unsure of exactly what was done). Pt reports decreased ROM in her left arm. Pt does have full ROM in the left hand.

## 2019-08-09 NOTE — Discharge Instructions (Addendum)
Your x-rays did not show any acute changes or areas that would be worrisome for recurrent cancer.  increase Tylenol from 500 to 1,000 mg 3-4 times a day as needed for pain,  may take the plain hydrocodone with Tylenol.  Make sure that the hydrocodone does not have Tylenol with it.  If it does, then do not take the additional Tylenol.  Do not exceed 4 g of Tylenol from all sources 24 hours.  Zanaflex for muscle spasms and the medrol Dosepak for inflammation.  Watch your sugars carefully as the steroids will increase your glucose.  Discontinue Flexeril.

## 2019-08-09 NOTE — ED Provider Notes (Signed)
HPI  SUBJECTIVE:  Dana Wilson is a right-handed 69 y.o. female who presents with 3 days of constant stabbing left neck pain that radiates into her shoulder and down into her arm.  She reports limitation of motion of her neck and arm secondary to the pain.  No trauma, she did not sleep on her neck wrong, no numbness tingling.  no fevers body aches chest pain shortness of breath abdominal pain.  No arm swelling, erythema.  No change in physical activity.  She states her left trapezius was swollen yesterday.  She states that she has had pain like this before in her neck prior to having the neck surgery, but this is different in the fact that this is now radiating down her arm.  She took Tylenol within 6 hours of evaluation.  She tried alternating plain hydrocodone with 500 mg of Tylenol every 6 hours as needed Flexeril and ice.  Symptoms are better with turning her head to the right, worse with turning her neck, flexion extension of the neck, turning her head to the left, moving left shoulder.  Past medical history  breast cancer, anal cancer no mets with either cancers, right upper extremity DVT, PE on Xarelto, diabetes hypertension, neck surgery x2, Covid pneumonia November 2020, Port-A-Cath on the right side since 2014.  No history of coronary disease, MI.  PMD: In [place].  She is visiting.  States that she plans on returning to [place] on 4/8.   Past Medical History:  Diagnosis Date  . Anal cancer ([place])   . Breast cancer ([place])   . Depression   . Diabetes mellitus without complication ([place])   . DVT (deep venous thrombosis) ([place])   . Hypertension   . Pulmonary embolus [facility])     Past Surgical History:  Procedure Laterality Date  . ABDOMINAL HYSTERECTOMY    . BACK SURGERY    . BREAST LUMPECTOMY    . CARPAL TUNNEL RELEASE    . COLOSTOMY    . MEDIAL PARTIAL KNEE REPLACEMENT    . NECK SURGERY    . TONSILLECTOMY      Family History  Problem Relation Age of Onset  . COPD Mother   .  Emphysema Mother   . Alzheimer's disease Father   . Stroke Sister   . Diabetes Sister   . Hypertension Sister   . Liver cancer Brother   . Diabetes Brother   . Hypertension Brother   . Breast cancer Paternal Aunt   . Breast cancer Cousin   . Colon cancer Cousin   . Prostate cancer Brother   . Diabetes Brother   . Hypertension Brother   . Sleep apnea Sister     Social History   Tobacco Use  . Smoking status: Former Smoker    Quit date: 01/17/1990    Years since quitting: 29.5  . Smokeless tobacco: Never Used  Substance Use Topics  . Alcohol use: No  . Drug use: No    No current facility-administered medications for this encounter.  Current Outpatient Medications:  .  buPROPion (ZYBAN) 150 MG 12 hr tablet, Take 150 mg by mouth daily., Disp: , Rfl:  .  DULoxetine (CYMBALTA) 60 MG capsule, Take 60 mg by mouth daily., Disp: , Rfl:  .  enalapril (VASOTEC) 20 MG tablet, Take 20 mg by mouth daily., Disp: , Rfl:  .  esomeprazole (NEXIUM) 40 MG capsule, Take 40 mg by mouth daily at 12 noon., Disp: , Rfl:  .  gabapentin (NEURONTIN)  100 MG capsule, Take 100 mg by mouth 3 (three) times daily., Disp: , Rfl:  .  HYDROcodone-acetaminophen (NORCO) 10-325 MG tablet, Take 1 tablet by mouth every 6 (six) hours as needed., Disp: , Rfl:  .  metFORMIN (GLUCOPHAGE-XR) 500 MG 24 hr tablet, Take 500 mg by mouth daily., Disp: , Rfl:  .  ondansetron (ZOFRAN-ODT) 8 MG disintegrating tablet, Take 8 mg by mouth 3 (three) times daily., Disp: , Rfl:  .  rivaroxaban (XARELTO) 20 MG TABS tablet, Take 20 mg by mouth daily with supper., Disp: , Rfl:  .  rizatriptan (MAXALT-MLT) 10 MG disintegrating tablet, Take 10 mg by mouth as needed for migraine. May repeat in 2 hours if needed, Disp: , Rfl:  .  rOPINIRole (REQUIP) 1 MG tablet, Take 1 mg by mouth at bedtime., Disp: , Rfl:  .  solifenacin (VESICARE) 10 MG tablet, Take by mouth daily., Disp: , Rfl:  .  TRELEGY ELLIPTA 100-62.5-25 MCG/INH AEPB, Inhale 1 puff  into the lungs daily., Disp: , Rfl:  .  methylPREDNISolone (MEDROL DOSEPAK) 4 MG TBPK tablet, Take by mouth daily. Follow package instructions, Disp: 21 tablet, Rfl: 0 .  tiZANidine (ZANAFLEX) 4 MG tablet, Take 1 tablet (4 mg total) by mouth every 8 (eight) hours as needed for muscle spasms., Disp: 30 tablet, Rfl: 0  Allergies  Allergen Reactions  . Calcium Diarrhea and Other (See Comments)    gas  . Other Other (See Comments)    Surgical steel gives an infection to the Pt.   Marland Kitchen Penicillins Hives  . Zolpidem Other (See Comments)    confusion  . Amoxicillin Rash  . Ceftriaxone Rash  . Cifenline Rash  . Ciprofloxacin Rash  . Levofloxacin Rash  . Tape Rash     ROS  As noted in HPI.   Physical Exam  BP (!) 144/88 (BP Location: Right Arm)   Pulse 71   Temp 98.3 F (36.8 C) (Oral) Comment: pt preports normal is 96.8  Ht 5\' 4"  (1.626 m)   Wt 117.9 kg   SpO2 98%   BMI 44.63 kg/m   Constitutional: Well developed, well nourished, no acute distress Eyes:  EOMI, conjunctiva normal bilaterally HENT: Normocephalic, atraumatic,mucus membranes moist Respiratory: Normal inspiratory effort Cardiovascular: Normal rate.  No tenderness over the Port-A-Cath right side. GI: nondistended, nontender skin: No rash, skin intact Musculoskeletal: Shoulder symmetric.  Positive diffuse C-spine tenderness.  Positive left trapezial tenderness. L shoulder with ROM somewhat limited-cannot AB duct past 90 degrees . drop test normal, clavicle NT, A/C joint NT , scapula NT , proximal humerus NT, Motor strength normal. Sensation intact LT over deltoid region, distal NVI with hand having grossly intact sensation and strength in the distribution of the median, radial, and ulnar nerve.  RP 2+.  No pain with internal rotation, pain with external rotation, Positive tenderness in bicipital groove, positive empty can test, negative liftoff test, no instability but the pain is aggravated with with abduction/external  rotation. Neurologic: Alert & oriented x 3, no focal neuro deficits Psychiatric: Speech and behavior appropriate   ED Course   Medications - No data to display  Orders Placed This Encounter  Procedures  . DG Cervical Spine Complete    Standing Status:   Standing    Number of Occurrences:   1    Order Specific Question:   Reason for Exam (SYMPTOM  OR DIAGNOSIS REQUIRED)    Answer:   neck, l shoulder pain h/o ca r/o mets, acute changes  .  DG Shoulder Left    Standing Status:   Standing    Number of Occurrences:   1    Order Specific Question:   Reason for Exam (SYMPTOM  OR DIAGNOSIS REQUIRED)    Answer:   neck, l shoulder pain h/o ca r/o mets, acute changes    No results found for this or any previous visit (from the past 24 hour(s)). DG Cervical Spine Complete  Result Date: 08/09/2019 CLINICAL DATA:  Cervicalgia.  History of breast carcinoma EXAM: CERVICAL SPINE - COMPLETE 4+ VIEW COMPARISON:  None. FINDINGS: Frontal, lateral, open-mouth odontoid, and bilateral oblique views were obtained. Patient is status post screw and plate fixation with disc spacer at C3 and C4. A second area of anterior screw and plate fixation noted from C5-C7 with disc spacers at C5-6 and C6-7. No fracture or spondylolisthesis. Prevertebral soft tissues and predental space regions are normal. There is partial ankylosis at the sites of disc spaces. Other disc spaces appear normal. There is bony hypertrophy and the facets at C3-4 bilaterally with exit foraminal narrowing bilaterally in this area. No similar changes elsewhere. No blastic or lytic bone lesions. Lung apices are clear. IMPRESSION: Postoperative change at multiple levels with support hardware intact at all levels. 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 Partial ankylosis at sites with disc spacers. Other disc spaces appear normal. No blastic or lytic bone lesions. No fracture or spondylolisthesis. Electronically Signed   By: Lowella Grip III M.D.    On: 08/09/2019 09:34   DG Shoulder Left  Result Date: 08/09/2019 CLINICAL DATA:  Pain.  History of breast carcinoma EXAM: LEFT SHOULDER - 2+ VIEW COMPARISON:  None. FINDINGS: Oblique, Y scapular, and axillary images were obtained. No fracture or dislocation. Joint spaces appear normal. No erosive change or intra-articular calcification. Visualized left lung clear. No blastic or lytic bone lesions. Postoperative change noted in lower cervical region. IMPRESSION: No fracture or dislocation. No evident arthropathy. No blastic or lytic bone lesions evident. Electronically Signed   By: Lowella Grip III M.D.   On: 08/09/2019 09:34    ED Clinical Impression  1. Neck pain   2. Acute pain of left shoulder      ED Assessment/Plan  No note of neck surgery in [facility] or care everywhere records  Appears to be musculoskeletal.  It is reproducible with palpation, range of motion. Doubt recurrent PE, upper extremity DVT, ACS, intra-abdominal or pulmonary process, septic joint.  Given her history of cancer, C-spine fusion/neck surgery x2,  will check C-spine and shoulder to rule out mets, or for any acute changes.  States that she is compliant with her Xarelto.  If x-rays are normal plan to send home with increased Tylenol from 500 to 1,000 mg 3-4 times a day as needed for pain, discussed with her that she may take the plain hydrocodone with Tylenol.  Will send home with Zanaflex and consider short course of steroids.  States that she has tolerated steroids before and has not had any issues with her glucose.  Discontinue Flexeril.  She states that she has plenty of hydrocodone.  She will follow-up with her doctor in [place] after 4/8.  Gave her ER return precautions.  Reviewed imaging independently.  No fracture, dislocation, blastic or lytic bone lesions evident.  Multiple levels of postoperative changes with hardware intact.  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  See radiology report for full details.  No acute  changes on plain films.  Plan As above  Discussed imaging, MDM, treatment  plan, and plan for follow-up with patient. Discussed sn/sx that should prompt return to the ED. patient agrees with plan.   Meds ordered this encounter  Medications  . methylPREDNISolone (MEDROL DOSEPAK) 4 MG TBPK tablet    Sig: Take by mouth daily. Follow package instructions    Dispense:  21 tablet    Refill:  0  . tiZANidine (ZANAFLEX) 4 MG tablet    Sig: Take 1 tablet (4 mg total) by mouth every 8 (eight) hours as needed for muscle spasms.    Dispense:  30 tablet    Refill:  0    *This clinic note was created using [employer]. Therefore, there may be occasional mistakes despite careful proofreading.   ?    Melynda Ripple, MD 08/09/19 [id number]

## 2019-08-15 ENCOUNTER — Ambulatory Visit
Admission: EM | Admit: 2019-08-15 | Discharge: 2019-08-15 | Disposition: A | Discharge status: Home / Self Care | Payer: Medicare Other | Attending: Family Medicine | Admitting: Family Medicine

## 2019-08-15 ENCOUNTER — Other Ambulatory Visit: Payer: Self-pay

## 2019-08-15 DIAGNOSIS — Z20822 Contact with and (suspected) exposure to covid-19: Secondary | ICD-10-CM | POA: Diagnosis not present

## 2019-08-15 LAB — SARS CORONAVIRUS 2 (TAT 6-24 HRS): SARS Coronavirus 2: NEGATIVE

## 2019-08-15 NOTE — ED Triage Notes (Addendum)
Pt presents with c/o COVID exposure to a positive family member. She denies any current symptoms such as fever/chills, cough, shob or nasal congestion. Pt is scheduled to fly out of town on Thursday. Pt did have COVID in November 2020.

## 2019-08-15 NOTE — Discharge Instructions (Signed)
Self quarantine and await test result

## 2019-08-18 ENCOUNTER — Other Ambulatory Visit: Payer: Self-pay

## 2019-08-18 ENCOUNTER — Ambulatory Visit: Payer: Medicare Other | Attending: Internal Medicine

## 2019-08-18 DIAGNOSIS — Z23 Encounter for immunization: Secondary | ICD-10-CM

## 2019-08-18 NOTE — Progress Notes (Signed)
   Covid-19 Vaccination Clinic  Name:  Dana Wilson    MRN: [id number] DOB: March 15, 1951  08/18/2019  [name] was observed post Covid-19 immunization for 30 minutes based on pre-vaccination screening without incident. She was provided with Vaccine Information Sheet and instruction to access the V-Safe system.   [name] was instructed to call 911 with any severe reactions post vaccine: Marland Kitchen Difficulty breathing  . Swelling of face and throat  . A fast heartbeat  . A bad rash all over body  . Dizziness and weakness   Immunizations Administered    Name Date Dose VIS Date Route   Pfizer COVID-19 Vaccine 08/18/2019  9:07 AM 0.3 mL 04/21/2019 Intramuscular   Manufacturer: [place]   Lot: [id number]   [place]: [id number]

## 2019-08-23 ENCOUNTER — Inpatient Hospital Stay: Payer: Medicare Other

## 2019-08-23 ENCOUNTER — Other Ambulatory Visit: Payer: Self-pay

## 2019-08-23 ENCOUNTER — Inpatient Hospital Stay: Payer: Medicare Other | Attending: Oncology | Admitting: Oncology

## 2019-08-23 VITALS — BP 138/76 | HR 75 | Temp 97.6°F | Resp 18

## 2019-08-23 DIAGNOSIS — Z85048 Personal history of other malignant neoplasm of rectum, rectosigmoid junction, and anus: Secondary | ICD-10-CM | POA: Diagnosis not present

## 2019-08-23 DIAGNOSIS — Z9221 Personal history of antineoplastic chemotherapy: Secondary | ICD-10-CM | POA: Diagnosis not present

## 2019-08-23 DIAGNOSIS — Z853 Personal history of malignant neoplasm of breast: Secondary | ICD-10-CM | POA: Diagnosis present

## 2019-08-23 DIAGNOSIS — C20 Malignant neoplasm of rectum: Secondary | ICD-10-CM | POA: Diagnosis not present

## 2019-08-23 DIAGNOSIS — Z452 Encounter for adjustment and management of vascular access device: Secondary | ICD-10-CM | POA: Diagnosis not present

## 2019-08-23 DIAGNOSIS — Z933 Colostomy status: Secondary | ICD-10-CM | POA: Insufficient documentation

## 2019-08-23 DIAGNOSIS — C50919 Malignant neoplasm of unspecified site of unspecified female breast: Secondary | ICD-10-CM | POA: Diagnosis not present

## 2019-08-23 DIAGNOSIS — Z923 Personal history of irradiation: Secondary | ICD-10-CM | POA: Diagnosis not present

## 2019-08-23 DIAGNOSIS — Z95828 Presence of other vascular implants and grafts: Secondary | ICD-10-CM

## 2019-08-23 MED ORDER — HEPARIN SOD (PORK) LOCK FLUSH 100 UNIT/ML IV SOLN
500.0000 [IU] | Freq: Once | INTRAVENOUS | Status: AC
  Administered 2019-08-23: 500 [IU] via INTRAVENOUS
  Filled 2019-08-23: qty 5

## 2019-08-23 MED ORDER — SODIUM CHLORIDE 0.9% FLUSH
10.0000 mL | Freq: Once | INTRAVENOUS | Status: AC
  Administered 2019-08-23: 10 mL via INTRAVENOUS
  Filled 2019-08-23: qty 10

## 2019-08-23 NOTE — Progress Notes (Signed)
Symptom Management Consult note [facility]  [phone] [phone]  Patient Care Team: Patient, No Pcp Per as PCP - General (General Practice)   Name of the patient: Dana Wilson  [id number]  [birth year]   Date of visit: 08/23/2019  Diagnosis: History of breast and anal cancer  Chief Complaint: Port flush  Current Treatment: Surveillance-oncologist in [place]  Oncology History: 69 year old female visiting from [place], [place].  Patient presents for routine port flush.  States she has a history of stage II left breast cancer (2015) s/p lumpectomy.  Had chemo and radiation.  States she did not require "hormone pill" after chemotherapy.  Additionally she has a history of anal cancer diagnosed in May 2016.  In patient thought she had hemorrhoids and went to her PCP.  Biopsy revealed anal cancer.  She also had radiation and chemo with colostomy in December 2016.  Required skilled nursing facility for several months after surgery.  Has fully recovered and receives follow-up in [place] where she was with her husband.  Had recent back surgery in January 2019.  States she is doing well.  Unfortunately, she does not have any records available with her.   Has history of recent urinary tract infection treated with antibiotics and pulmonary embolism.  On chronic Xarelto since 2018.  States she is here often would like to establish care so that she may have her port flushed while visiting her daughters..  Subjective Data: Mrs. Dana Wilson presents to symptom management with history of stage II left breast cancer in 2015 status post lumpectomy along with chemo and radiation and did not require aromatase inhibitor for port flush while visiting her daughter in [place].  She resides in [place] with her husband but has extended vacations with her daughters several times a year.  Has history of anal cancer diagnosed in May 2016 requiring radiation/chemo  with colostomy in December 2016.  She required several months of skilled nursing facility after surgery.  One of her daughters that lives in [place] recently had bariatric surgery and her stay here [place] has been extended due to complications.  She is requesting a port flush.  She is doing well. No new complaints.   ECOG: 1 - Symptomatic but completely ambulatory   Physical Exam Constitutional:      Appearance: Normal appearance.  HENT:     Head: Normocephalic and atraumatic.  Eyes:     Pupils: Pupils are equal, round, and reactive to light.  Cardiovascular:     Rate and Rhythm: Normal rate and regular rhythm.     Heart sounds: Normal heart sounds. No murmur.  Pulmonary:     Effort: Pulmonary effort is normal.     Breath sounds: Normal breath sounds. No wheezing.  Abdominal:     General: Bowel sounds are normal. There is no distension.     Palpations: Abdomen is soft.     Tenderness: There is no abdominal tenderness.  Musculoskeletal:        General: Normal range of motion.     Cervical back: Normal range of motion.  Skin:    General: Skin is warm and dry.     Findings: No rash.  Neurological:     Mental Status: She is alert and oriented to person, place, and time.  Psychiatric:        Judgment: Judgment normal.   Review of Systems  Constitutional: Negative.  Negative for chills, fever, malaise/fatigue and weight loss.  HENT: Negative for  congestion, ear pain and tinnitus.   Eyes: Negative.  Negative for blurred vision and double vision.  Respiratory: Negative.  Negative for cough, sputum production and shortness of breath.   Cardiovascular: Negative.  Negative for chest pain, palpitations and leg swelling.  Gastrointestinal: Negative.  Negative for abdominal pain, constipation, diarrhea, nausea and vomiting.  Genitourinary: Negative for dysuria, frequency and urgency.  Musculoskeletal: Negative for back pain and falls.  Skin: Negative.  Negative  for rash.  Neurological: Negative.  Negative for weakness and headaches.  Endo/Heme/Allergies: Negative.  Does not bruise/bleed easily.  Psychiatric/Behavioral: Negative.  Negative for depression. The patient is not nervous/anxious and does not have insomnia.    Plan and Assessment: Port flush today  Disposition: Return to clinic as needed for port flush in the future.  Greater than 50% was spent in counseling and coordination of care with this patient including but not limited to discussion of the relevant topics above (See A&P) including, but not limited to diagnosis and management of acute and chronic medical conditions.   Faythe Casa, NP 09/25/2019 3:42 PM

## 2019-08-28 NOTE — ED Provider Notes (Signed)
[facility]    CSN: [id number] Arrival date & time: 08/15/19  1043      History   Chief Complaint Chief Complaint  Patient presents with  . Covid Exposure    HPI Dana Wilson is a 69 y.o. female.   69 yo female with a c/o covid exposure to positive family member. Denies any symptoms. States she's schedule to fly out back home later this week.      Past Medical History:  Diagnosis Date  . Anal cancer ([place])   . Breast cancer ([place])   . Depression   . Diabetes mellitus without complication ([place])   . DVT (deep venous thrombosis) ([place])   . Hypertension   . Pulmonary embolus ([facility])     There are no problems to display for this patient.   Past Surgical History:  Procedure Laterality Date  . ABDOMINAL HYSTERECTOMY    . BACK SURGERY    . BREAST LUMPECTOMY    . CARPAL TUNNEL RELEASE    . COLOSTOMY    . MEDIAL PARTIAL KNEE REPLACEMENT    . NECK SURGERY    . TONSILLECTOMY      OB History    Gravida  5   Para  3   Term  3   Preterm  0   AB  2   Living  0     SAB  2   TAB  0   Ectopic  0   Multiple  0   Live Births               Home Medications    Prior to Admission medications   Medication Sig Start Date End Date Taking? Authorizing Provider  buPROPion (ZYBAN) 150 MG 12 hr tablet Take 150 mg by mouth daily.   Yes [provider]  DULoxetine (CYMBALTA) 60 MG capsule Take 60 mg by mouth daily.   Yes [provider]  enalapril (VASOTEC) 20 MG tablet Take 20 mg by mouth daily.   Yes [provider]  esomeprazole (NEXIUM) 40 MG capsule Take 40 mg by mouth daily at 12 noon.   Yes [provider]  gabapentin (NEURONTIN) 100 MG capsule Take 100 mg by mouth 3 (three) times daily.   Yes [provider]  HYDROcodone-acetaminophen (NORCO) 10-325 MG tablet Take 1 tablet by mouth every 6 (six) hours as needed.   Yes [provider]  metFORMIN (GLUCOPHAGE-XR) 500 MG 24 hr tablet Take 500 mg  by mouth daily. 07/16/19  Yes [provider]  ondansetron (ZOFRAN-ODT) 8 MG disintegrating tablet Take 8 mg by mouth 3 (three) times daily. 06/26/19  Yes [provider]  OXYGEN Inhale 4 L into the lungs at bedtime.   Yes [provider]  rivaroxaban (XARELTO) 20 MG TABS tablet Take 20 mg by mouth daily with supper.   Yes [provider]  rizatriptan (MAXALT-MLT) 10 MG disintegrating tablet Take 10 mg by mouth as needed for migraine. May repeat in 2 hours if needed   Yes [provider]  rOPINIRole (REQUIP) 1 MG tablet Take 1 mg by mouth at bedtime.   Yes [provider]  solifenacin (VESICARE) 10 MG tablet Take by mouth daily.   Yes [provider]  tiZANidine (ZANAFLEX) 4 MG tablet Take 1 tablet (4 mg total) by mouth every 8 (eight) hours as needed for muscle spasms. 08/09/19  Yes Melynda Ripple, MD  TRELEGY ELLIPTA 100-62.5-25 MCG/INH AEPB Inhale 1 puff into the lungs  daily. 07/17/19  Yes [provider]  methylPREDNISolone (MEDROL DOSEPAK) 4 MG TBPK tablet Take by mouth daily. Follow package instructions 08/09/19   Melynda Ripple, MD    Family History Family History  Problem Relation Age of Onset  . COPD Mother   . Emphysema Mother   . Alzheimer's disease Father   . Stroke Sister   . Diabetes Sister   . Hypertension Sister   . Liver cancer Brother   . Diabetes Brother   . Hypertension Brother   . Breast cancer Paternal Aunt   . Breast cancer Cousin   . Colon cancer Cousin   . Prostate cancer Brother   . Diabetes Brother   . Hypertension Brother   . Sleep apnea Sister     Social History Social History   Tobacco Use  . Smoking status: Former Smoker    Quit date: 01/17/1990    Years since quitting: 29.6  . Smokeless tobacco: Never Used  Substance Use Topics  . Alcohol use: No  . Drug use: No     Allergies   Calcium, Other, Penicillins, Zolpidem, Amoxicillin, Ceftriaxone, Cifenline, Ciprofloxacin,  Levofloxacin, and Tape   Review of Systems Review of Systems   Physical Exam Triage Vital Signs ED Triage Vitals  Enc Vitals Group     BP 08/15/19 1110 127/87     Pulse Rate 08/15/19 1110 77     Resp 08/15/19 1110 18     Temp 08/15/19 1110 98.8 F (37.1 C)     Temp Source 08/15/19 1110 Oral     SpO2 08/15/19 1110 98 %     Weight 08/15/19 1108 260 lb (117.9 kg)     Height 08/15/19 1108 5\' 4"  (1.626 m)     Head Circumference --      Peak Flow --      Pain Score 08/15/19 1107 0     Pain Loc --      Pain Edu? --      Excl. in [place]? --    No data found.  Updated Vital Signs BP 127/87 (BP Location: Left Arm)   Pulse 77   Temp 98.8 F (37.1 C) (Oral)   Resp 18   Ht 5\' 4"  (1.626 m)   Wt 117.9 kg   SpO2 98%   BMI 44.63 kg/m   Visual Acuity Right Eye Distance:   Left Eye Distance:   Bilateral Distance:    Right Eye Near:   Left Eye Near:    Bilateral Near:     Physical Exam Vitals and nursing note reviewed.  Constitutional:      General: She is not in acute distress.    Appearance: She is not toxic-appearing or diaphoretic.  Pulmonary:     Effort: Pulmonary effort is normal. No respiratory distress.  Neurological:     Mental Status: She is alert.      UC Treatments / Results  Labs (all labs ordered are listed, but only abnormal results are displayed) Labs Reviewed  SARS CORONAVIRUS 2 (TAT 6-24 HRS)    EKG   Radiology No results found.  Procedures Procedures (including critical care time)  Medications Ordered in UC Medications - No data to display  Initial Impression / Assessment and Plan / UC Course  I have reviewed the triage vital signs and the nursing notes.  Pertinent labs & imaging results that were available during my care of the patient were reviewed by me and considered in my medical decision making (see chart for  details).      Final Clinical Impressions(s) / UC Diagnoses   Final diagnoses:  Exposure to COVID-19 virus      Discharge Instructions     Self quarantine and await test result    ED Prescriptions    None      1. diagnosis reviewed with patient 2. covid test done 3. F/u prn   PDMP not reviewed this encounter.   Norval Gable, MD 08/28/19 1925

## 2019-09-13 ENCOUNTER — Ambulatory Visit: Payer: Medicare Other | Attending: Internal Medicine

## 2019-09-13 DIAGNOSIS — Z23 Encounter for immunization: Secondary | ICD-10-CM

## 2019-09-13 NOTE — Progress Notes (Signed)
   Covid-19 Vaccination Clinic  Name:  Dana Wilson    MRN: [id number] DOB: September 02, 1950  09/13/2019  [name] was observed post Covid-19 immunization for 30 minutes based on pre-vaccination screening without incident. She was provided with Vaccine Information Sheet and instruction to access the V-Safe system.   [name] was instructed to call 911 with any severe reactions post vaccine: Marland Kitchen Difficulty breathing  . Swelling of face and throat  . A fast heartbeat  . A bad rash all over body  . Dizziness and weakness   Immunizations Administered    Name Date Dose VIS Date Route   Pfizer COVID-19 Vaccine 09/13/2019  8:57 AM 0.3 mL 07/05/2018 Intramuscular   Manufacturer: [place]   Lot: [id number]   [place]: [id number]

## 2020-07-11 ENCOUNTER — Encounter: Payer: Self-pay | Admitting: Emergency Medicine

## 2020-07-11 ENCOUNTER — Ambulatory Visit
Admission: EM | Admit: 2020-07-11 | Discharge: 2020-07-11 | Disposition: A | Discharge status: Home / Self Care | Payer: Medicare Other | Attending: Family Medicine | Admitting: Family Medicine

## 2020-07-11 ENCOUNTER — Other Ambulatory Visit: Payer: Self-pay

## 2020-07-11 DIAGNOSIS — M436 Torticollis: Secondary | ICD-10-CM | POA: Diagnosis not present

## 2020-07-11 MED ORDER — HYDROCODONE-ACETAMINOPHEN 5-325 MG PO TABS: 1.0000 | ORAL_TABLET | Freq: Three times a day (TID) | ORAL | 0 refills | Status: AC | PRN

## 2020-07-11 MED ORDER — TIZANIDINE HCL 4 MG PO TABS: 4.0000 mg | ORAL_TABLET | Freq: Three times a day (TID) | ORAL | 0 refills | Status: AC | PRN

## 2020-07-11 NOTE — Discharge Instructions (Signed)
Rest.  Heat.  Medication as prescribed.  Take care  Dr. Monigue Spraggins  

## 2020-07-11 NOTE — ED Triage Notes (Signed)
Patient c/o neck pain that started 4-5 days ago. She denies injury.

## 2020-07-12 NOTE — ED Provider Notes (Addendum)
[facility]    CSN: [id number] Arrival date & time: 07/11/20  1827  History   Chief Complaint Chief Complaint  Patient presents with  . Neck Pain   HPI  70 year old female presents with the above complaint.  Patient reports that she has had ongoing neck pain for the past 4 to 5 days.  She reports stiffness and decreased range of motion.  Pain is 10/10 in severity.  She has tried conservative treatment at home without resolution.  No fall, trauma, injury.  Exacerbated by movement.  No relieving factors.   Past Medical History:  Diagnosis Date  . Anal cancer ([place])   . Breast cancer ([place])   . Depression   . Diabetes mellitus without complication ([place])   . DVT (deep venous thrombosis) ([place])   . Hypertension   . Pulmonary embolus [facility])    Past Surgical History:  Procedure Laterality Date  . ABDOMINAL HYSTERECTOMY    . BACK SURGERY    . BREAST LUMPECTOMY    . CARPAL TUNNEL RELEASE    . COLOSTOMY    . MEDIAL PARTIAL KNEE REPLACEMENT    . NECK SURGERY    . TONSILLECTOMY      OB History    Gravida  5   Para  3   Term  3   Preterm  0   AB  2   Living  0     SAB  2   IAB  0   Ectopic  0   Multiple  0   Live Births               Home Medications    Prior to Admission medications   Medication Sig Start Date End Date Taking? Authorizing Provider  DULoxetine (CYMBALTA) 60 MG capsule Take 60 mg by mouth daily.   Yes [provider]  HYDROcodone-acetaminophen (NORCO/VICODIN) 5-325 MG tablet Take 1 tablet by mouth every 8 (eight) hours as needed. 07/11/20  Yes Gladyse Corvin G, DO  levothyroxine (SYNTHROID) 25 MCG tablet levothyroxine 25 mcg tablet   Yes [provider]  metFORMIN (GLUCOPHAGE-XR) 500 MG 24 hr tablet Take 500 mg by mouth daily. 07/16/19  Yes [provider]  OXYGEN Inhale 4 L into the lungs at bedtime.   Yes [provider]  rivaroxaban (XARELTO) 20 MG TABS tablet Take 20 mg by mouth daily with  supper.   Yes [provider]  rizatriptan (MAXALT-MLT) 10 MG disintegrating tablet Take 10 mg by mouth as needed for migraine. May repeat in 2 hours if needed   Yes [provider]  solifenacin (VESICARE) 10 MG tablet Take by mouth daily.   Yes [provider]  tiZANidine (ZANAFLEX) 4 MG tablet Take 1 tablet (4 mg total) by mouth every 8 (eight) hours as needed for muscle spasms. 07/11/20  Yes Nissan Frazzini G, DO  TRELEGY ELLIPTA 100-62.5-25 MCG/INH AEPB Inhale 1 puff into the lungs daily. 07/17/19  Yes [provider]  enalapril (VASOTEC) 20 MG tablet Take 20 mg by mouth daily.  07/11/20  [provider]  esomeprazole (NEXIUM) 40 MG capsule Take 40 mg by mouth daily at 12 noon.  07/11/20  [provider]  gabapentin (NEURONTIN) 100 MG capsule Take 100 mg by mouth 3 (three) times daily.  07/11/20  [provider]  rOPINIRole (REQUIP) 1 MG tablet Take 1 mg by mouth at bedtime.  07/11/20  [provider]    Family History Family History  Problem Relation  Age of Onset  . COPD Mother   . Emphysema Mother   . Alzheimer's disease Father   . Stroke Sister   . Diabetes Sister   . Hypertension Sister   . Liver cancer Brother   . Diabetes Brother   . Hypertension Brother   . Breast cancer Paternal Aunt   . Breast cancer Cousin   . Colon cancer Cousin   . Prostate cancer Brother   . Diabetes Brother   . Hypertension Brother   . Sleep apnea Sister     Social History Social History   Tobacco Use  . Smoking status: Former Smoker    Quit date: 01/17/1990    Years since quitting: 30.5  . Smokeless tobacco: Never Used  Vaping Use  . Vaping Use: Never used  Substance Use Topics  . Alcohol use: No  . Drug use: No     Allergies   Calcium, Other, Penicillins, Zolpidem, Amoxicillin, Ceftriaxone, Cifenline, Ciprofloxacin, Levofloxacin, and Tape   Review of Systems Review of Systems  Constitutional: Negative.   Musculoskeletal:  Positive for neck pain.   Physical Exam Triage Vital Signs ED Triage Vitals  Enc Vitals Group     BP 07/11/20 1902 140/86     Pulse Rate 07/11/20 1902 63     Resp 07/11/20 1902 18     Temp 07/11/20 1902 98.3 F (36.8 C)     Temp Source 07/11/20 1902 Oral     SpO2 07/11/20 1902 98 %     Weight 07/11/20 1856 250 lb (113.4 kg)     Height 07/11/20 1856 5\' 4"  (1.626 m)     Head Circumference --      Peak Flow --      Pain Score 07/11/20 1855 10     Pain Loc --      Pain Edu? --      Excl. in [place]? --    Updated Vital Signs BP 140/86 (BP Location: Right Arm)   Pulse 63   Temp 98.3 F (36.8 C) (Oral)   Resp 18   Ht 5\' 4"  (1.626 m)   Wt 113.4 kg   SpO2 98%   BMI 42.91 kg/m   Visual Acuity Right Eye Distance:   Left Eye Distance:   Bilateral Distance:    Right Eye Near:   Left Eye Near:    Bilateral Near:     Physical Exam Constitutional:      General: She is not in acute distress.    Appearance: Normal appearance. She is not ill-appearing.  HENT:     Head: Normocephalic and atraumatic.  Eyes:     General:        Right eye: No discharge.        Left eye: No discharge.     Conjunctiva/sclera: Conjunctivae normal.  Neck:     Comments: Decreased range of motion of the neck particularly in left rotation. Cardiovascular:     Rate and Rhythm: Normal rate and regular rhythm.  Pulmonary:     Effort: Pulmonary effort is normal.     Breath sounds: Normal breath sounds. No wheezing or rales.  Neurological:     Mental Status: She is alert.  Psychiatric:        Mood and Affect: Mood normal.        Behavior: Behavior normal.    UC Treatments / Results  Labs (all labs ordered are listed, but only abnormal results are displayed) Labs Reviewed - No data to display  EKG   Radiology No results found.  Procedures Procedures (including critical care time)  Medications Ordered in UC Medications - No data to display  Initial Impression / Assessment and Plan / UC  Course  I have reviewed the triage vital signs and the nursing notes.  Pertinent labs & imaging results that were available during my care of the patient were reviewed by me and considered in my medical decision making (see chart for details).    70 year old female presents with torticollis.  Treating with Zanaflex and PRN Vicodin.  Final Clinical Impressions(s) / UC Diagnoses   Final diagnoses:  Torticollis     Discharge Instructions     Rest.  Heat.  Medication as prescribed.  Take care  Dr. Lacinda Axon     ED Prescriptions    Medication Sig Dispense Auth. Provider   tiZANidine (ZANAFLEX) 4 MG tablet Take 1 tablet (4 mg total) by mouth every 8 (eight) hours as needed for muscle spasms. 30 tablet Iman Reinertsen G, DO   HYDROcodone-acetaminophen (NORCO/VICODIN) 5-325 MG tablet Take 1 tablet by mouth every 8 (eight) hours as needed. 10 tablet Thersa Salt G, DO     I have reviewed the PDMP during this encounter.    Coral Spikes, [place] 07/12/20 [phone]

## 2020-08-10 IMAGING — CR DG SHOULDER 2+V*L*
3 series · 3 of 3 positions shown · non-contrast
Comparison: None.

CLINICAL DATA: Pain.  History of breast carcinoma

EXAM:
LEFT SHOULDER - 2+ VIEW

[shoulder grashey]
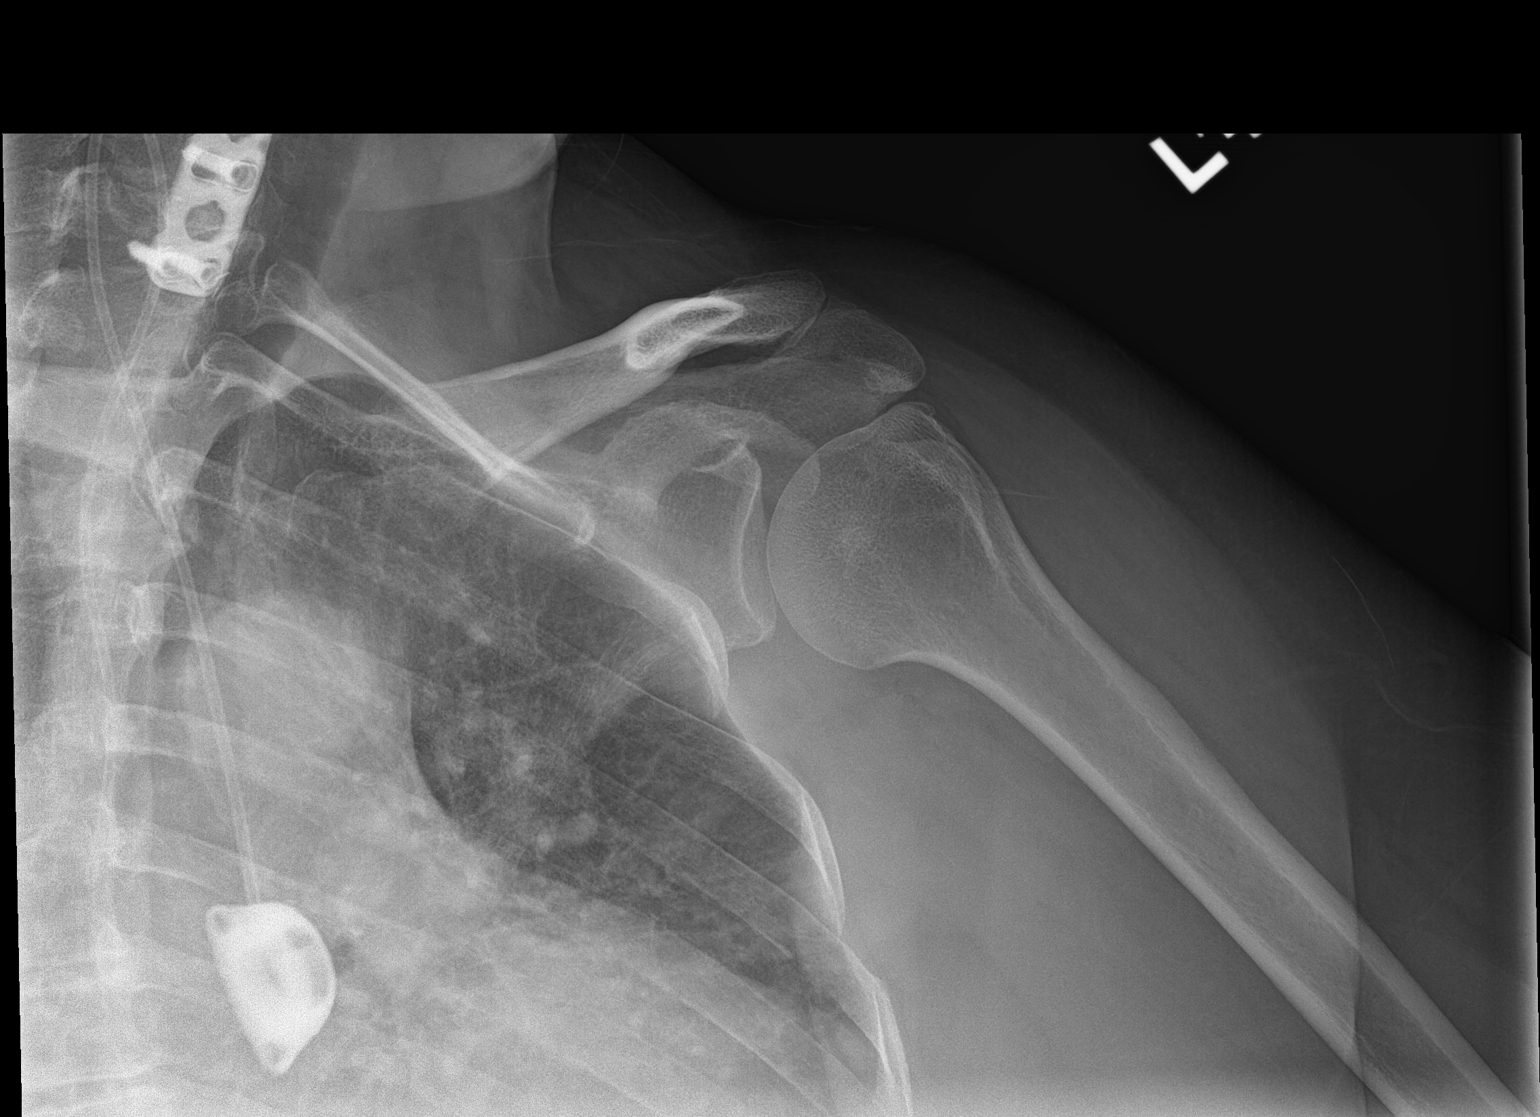

[shoulder y view]
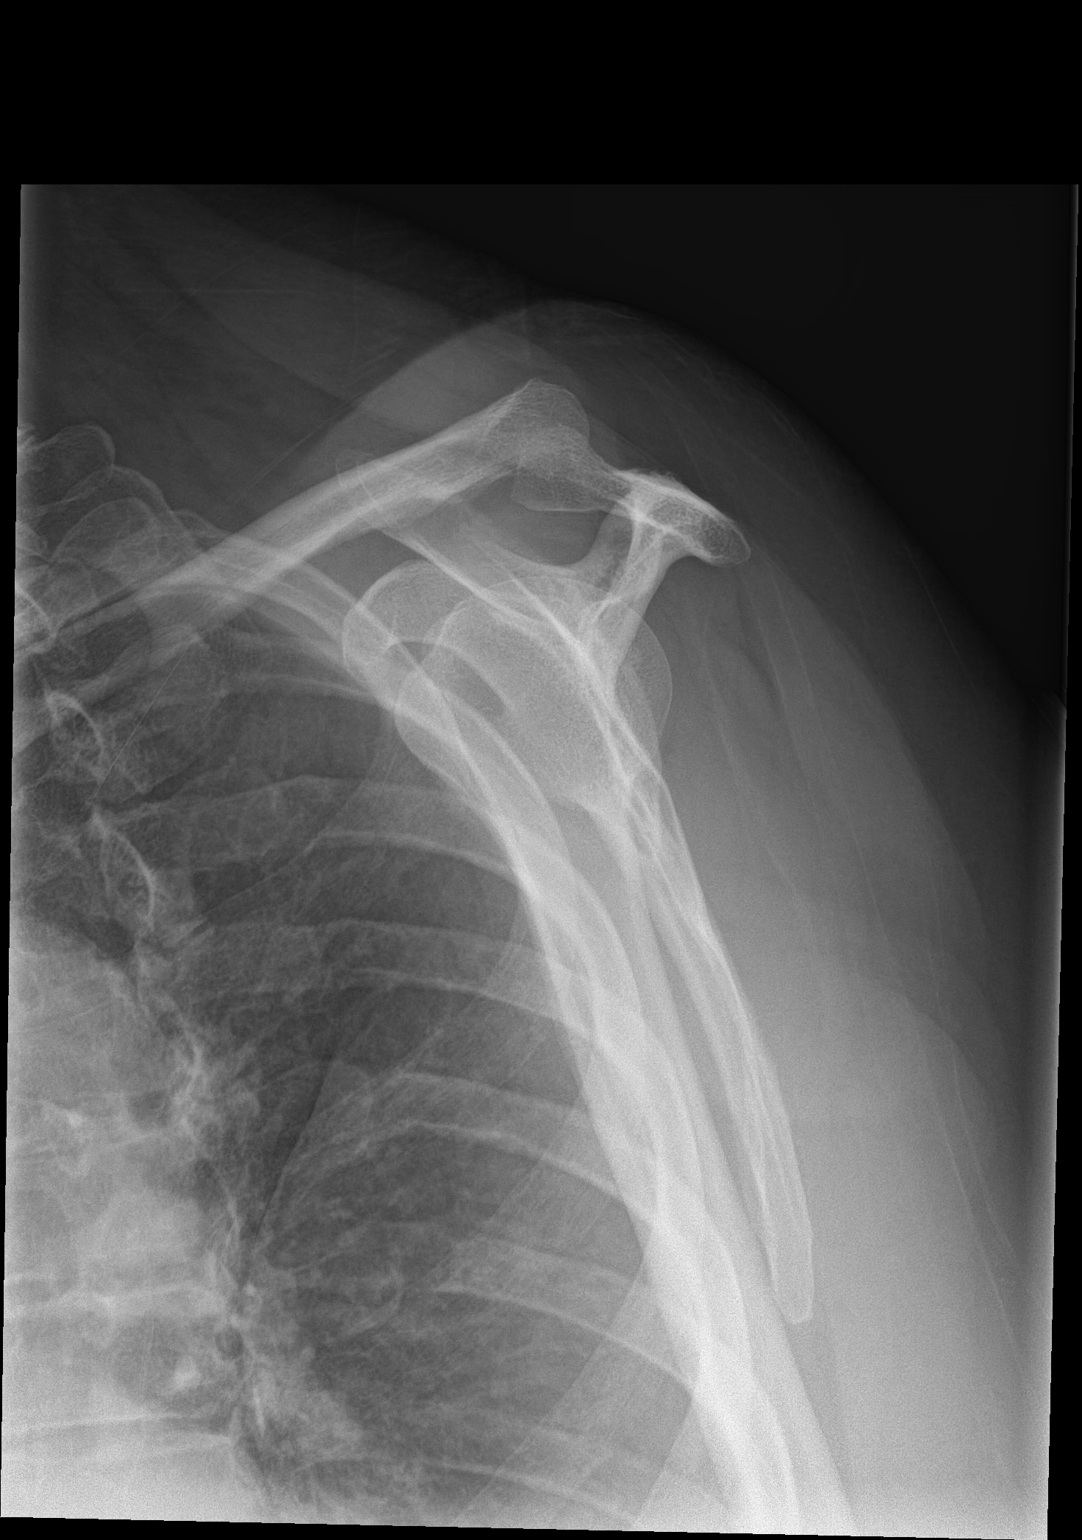

[shoulder axial]
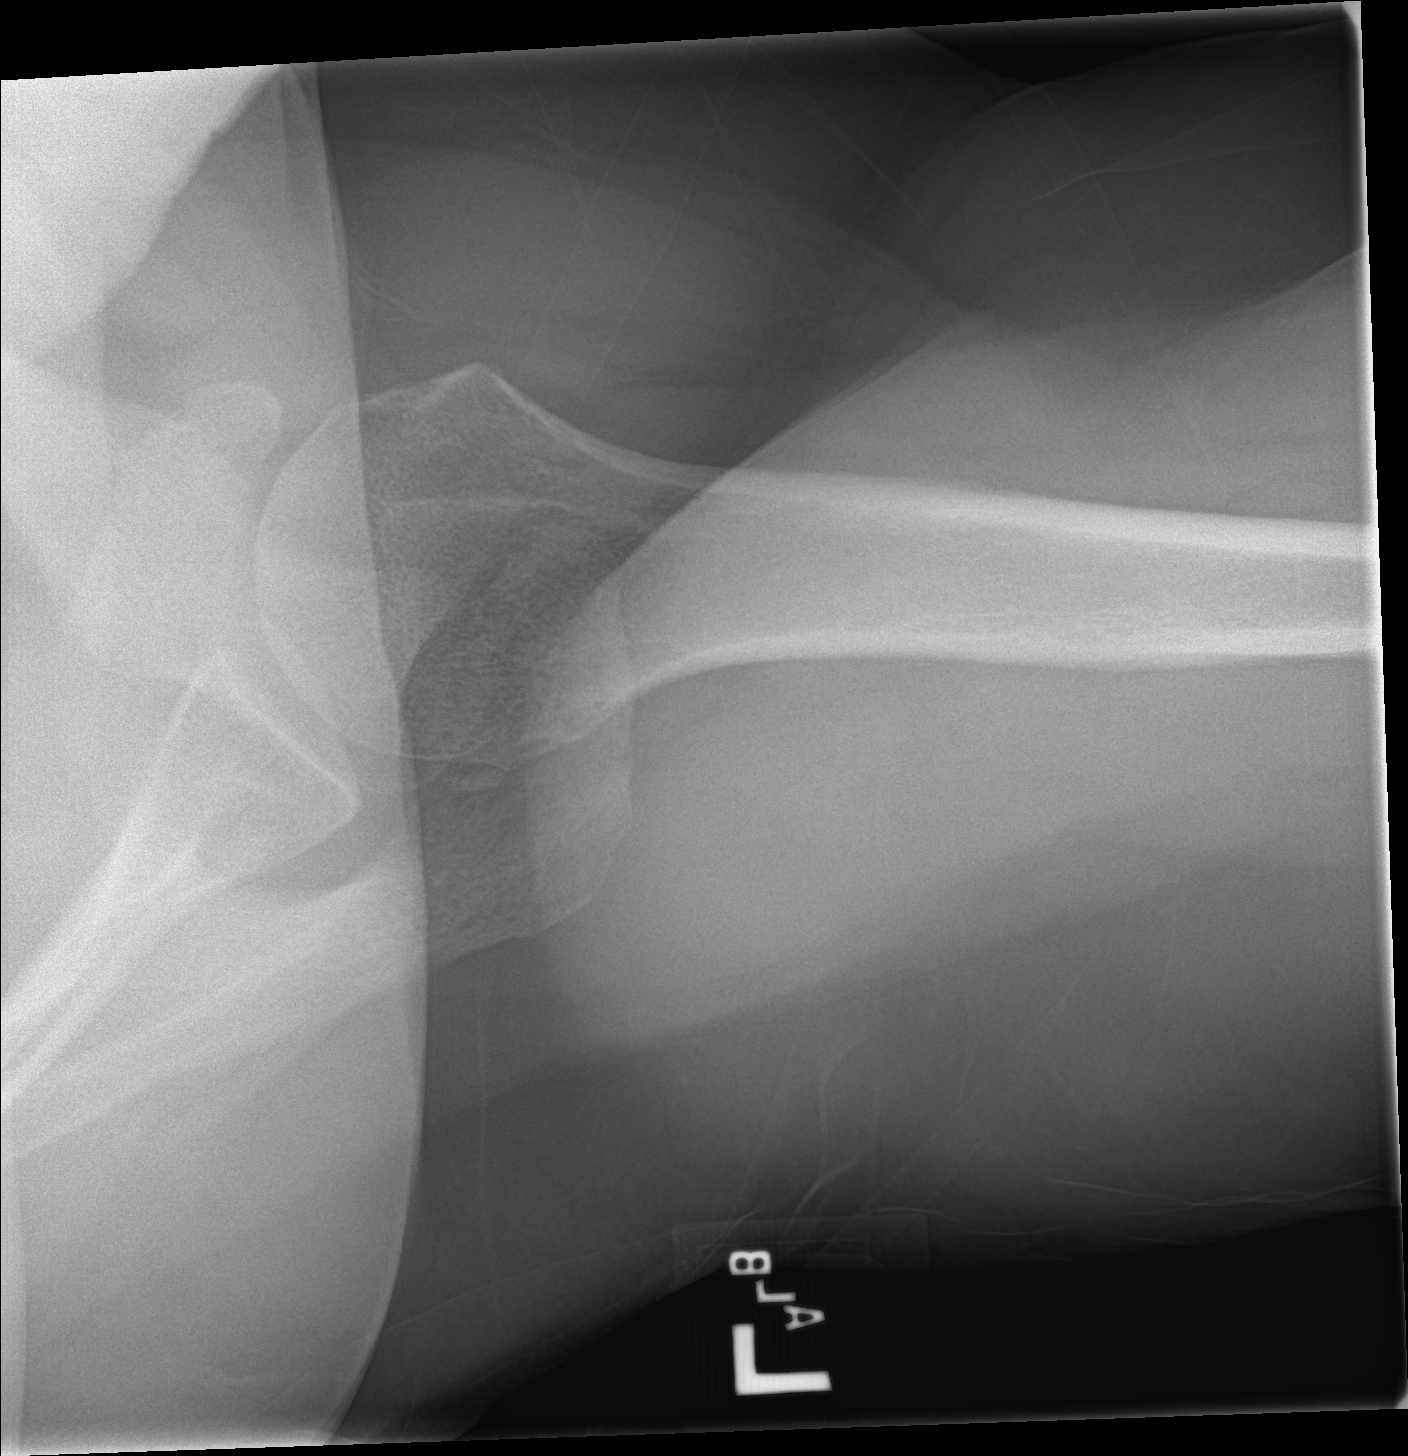

[3 of 3 positions shown; findings below may reference images not displayed]

FINDINGS: Oblique, Y scapular, and axillary images were obtained. No fracture
or dislocation. Joint spaces appear normal. No erosive change or
intra-articular calcification. Visualized left lung clear. No
blastic or lytic bone lesions. Postoperative change noted in lower
cervical region.
IMPRESSION: No fracture or dislocation. No evident arthropathy. No blastic or
lytic bone lesions evident.
